# Patient Record
Sex: Female | Born: 1990 | Race: White | Hispanic: No | Marital: Single | Smoking: Never smoker
Health system: Southern US, Community
[De-identification: ages and names within clinical notes are randomized; demographics above are authoritative.]

## PROBLEM LIST (undated history)

## (undated) DIAGNOSIS — N289 Disorder of kidney and ureter, unspecified: Secondary | ICD-10-CM

## (undated) HISTORY — PX: WISDOM TOOTH EXTRACTION: SHX21

---

## 2020-02-07 ENCOUNTER — Emergency Department (HOSPITAL_BASED_OUTPATIENT_CLINIC_OR_DEPARTMENT_OTHER): Payer: No Typology Code available for payment source

## 2020-02-07 ENCOUNTER — Encounter (HOSPITAL_BASED_OUTPATIENT_CLINIC_OR_DEPARTMENT_OTHER): Payer: Self-pay | Admitting: Emergency Medicine

## 2020-02-07 ENCOUNTER — Emergency Department (HOSPITAL_BASED_OUTPATIENT_CLINIC_OR_DEPARTMENT_OTHER)
Admission: EM | Admit: 2020-02-07 | Discharge: 2020-02-07 | Disposition: A | Discharge status: Home / Self Care | Payer: No Typology Code available for payment source | Attending: Emergency Medicine | Admitting: Emergency Medicine

## 2020-02-07 DIAGNOSIS — M7918 Myalgia, other site: Secondary | ICD-10-CM | POA: Insufficient documentation

## 2020-02-07 DIAGNOSIS — N289 Disorder of kidney and ureter, unspecified: Secondary | ICD-10-CM | POA: Insufficient documentation

## 2020-02-07 HISTORY — DX: Disorder of kidney and ureter, unspecified: N28.9

## 2020-02-07 NOTE — Discharge Instructions (Signed)
Take Motrin and Tylenol as needed as directed. Limit lifting to 10 pounds.  Recheck with your PCP if pain progresses, return to ER for severe concerning symptoms.
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
## 2024-08-12 ENCOUNTER — Ambulatory Visit: Payer: Self-pay | Admitting: Sports Medicine
